# Patient Record
Sex: Male | Born: 1975 | Race: White | Marital: Single | Smoking: Never smoker
Health system: Southern US, Community
[De-identification: ages and names within clinical notes are randomized; demographics above are authoritative.]

## PROBLEM LIST (undated history)

## (undated) DIAGNOSIS — J45909 Unspecified asthma, uncomplicated: Secondary | ICD-10-CM

---

## 2011-04-16 ENCOUNTER — Ambulatory Visit
Admission: RE | Admit: 2011-04-16 | Discharge: 2011-04-16 | Disposition: A | Discharge status: Home / Self Care | Payer: PRIVATE HEALTH INSURANCE | Source: Ambulatory Visit | Attending: Family Medicine | Admitting: Family Medicine

## 2011-04-16 ENCOUNTER — Other Ambulatory Visit: Payer: Self-pay | Admitting: Family Medicine

## 2011-04-16 DIAGNOSIS — R1032 Left lower quadrant pain: Secondary | ICD-10-CM

## 2011-04-16 MED ORDER — IOHEXOL 300 MG/ML  SOLN
100.0000 mL | Freq: Once | INTRAMUSCULAR | Status: AC | PRN
  Administered 2011-04-16: 100 mL via INTRAVENOUS

## 2012-03-13 IMAGING — CT CT ABD-PELV W/ CM
2 of 4 series · 17 of 46 positions shown, 19 images · IV contrast (30CC OMNI 300 & [ID] OMNI 300)
Comparison: None.

CLINICAL DATA: Left lower quadrant pain.  Rule out diverticulitis.

CT ABDOMEN AND PELVIS WITH CONTRAST
TECHNIQUE: Multidetector CT imaging of the abdomen and pelvis was
performed following the standard protocol during bolus
administration of intravenous contrast.
Contrast: 100mL OMNIPAQUE IOHEXOL 300 MG/ML IV SOLN

[Series 2: abd/pelvis with · axial · 0.70mm/px · z∈[-386,+14]mm · 14 of 88 slices shown, 16 images]
[im 4/88  soft-tissue]
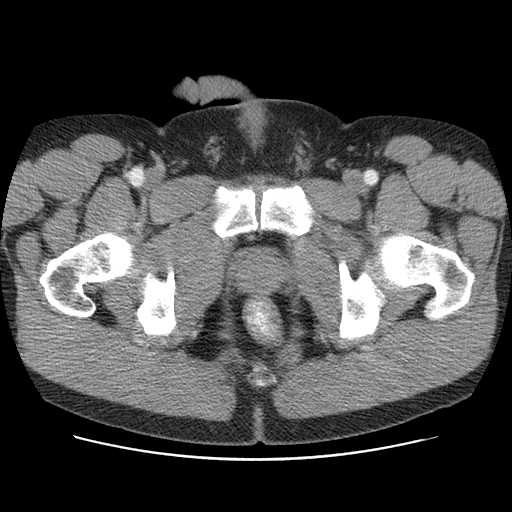
[im 4/88  bone]
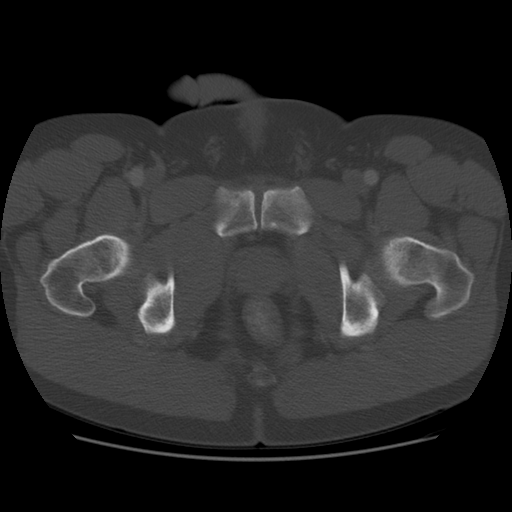
[im 11/88  soft-tissue]
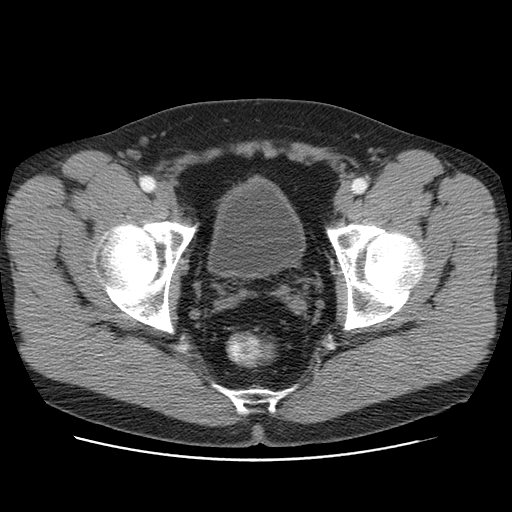
[im 18/88  soft-tissue]
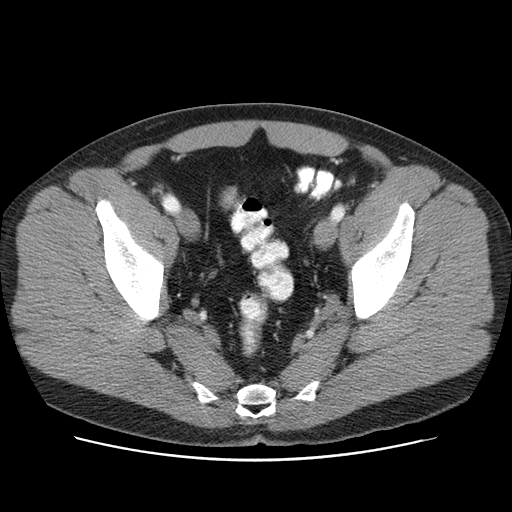
[im 25/88  soft-tissue]
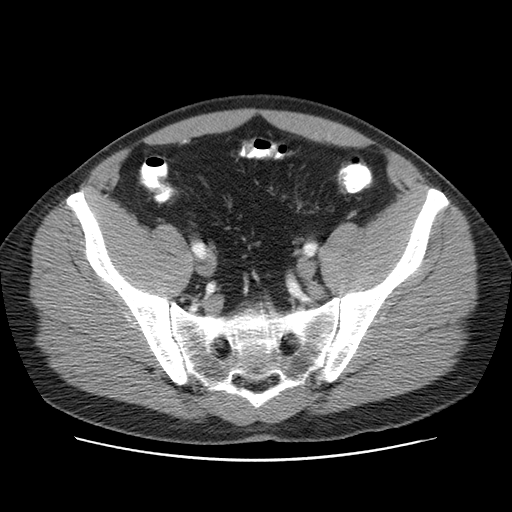
[im 28/88  soft-tissue]
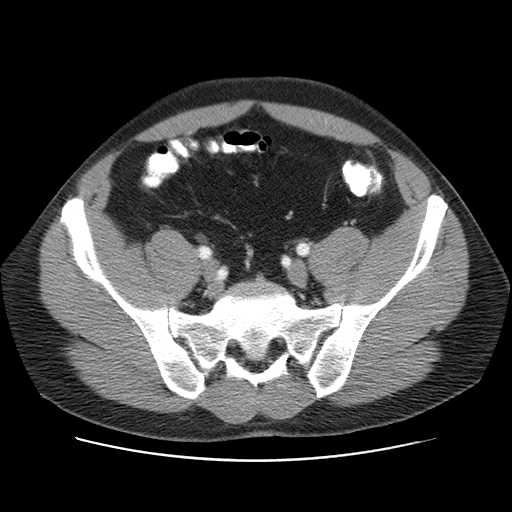
[im 35/88  soft-tissue]
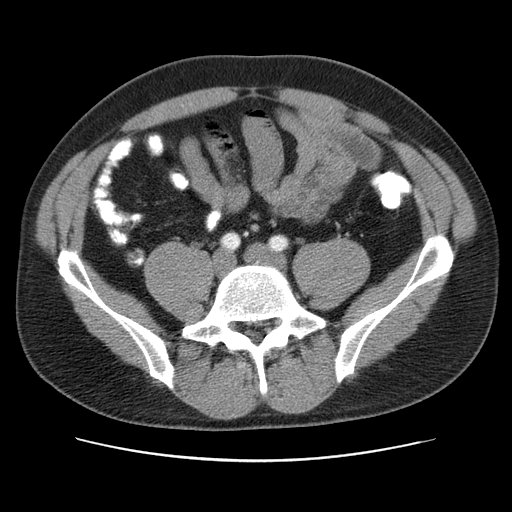
[im 42/88  soft-tissue]
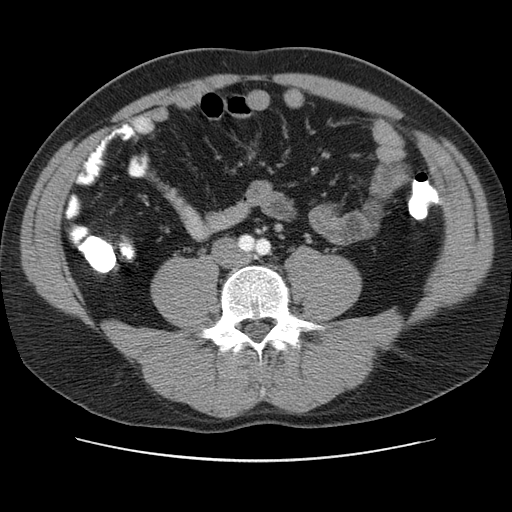
[im 46/88  soft-tissue]
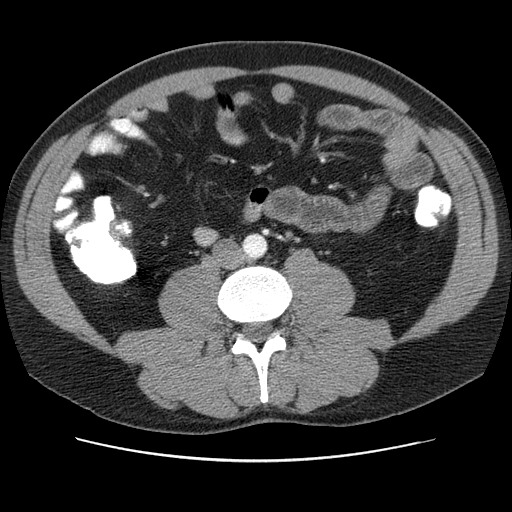
[im 53/88  soft-tissue]
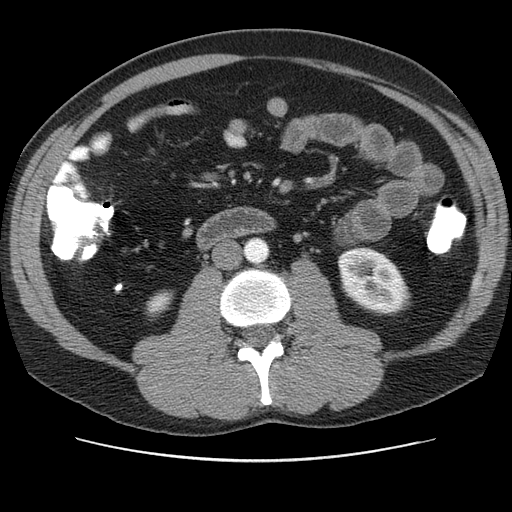
[im 53/88  bone]
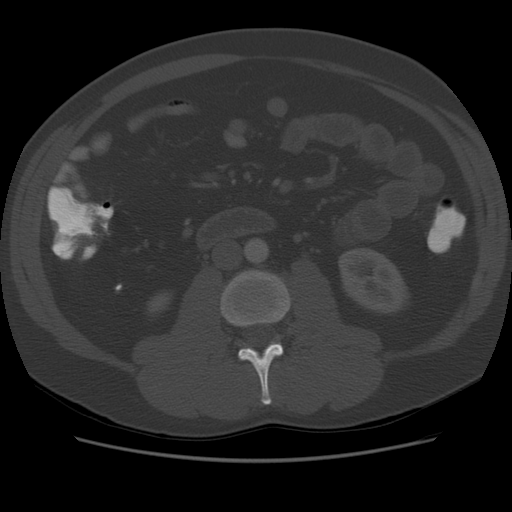
[im 60/88  soft-tissue]
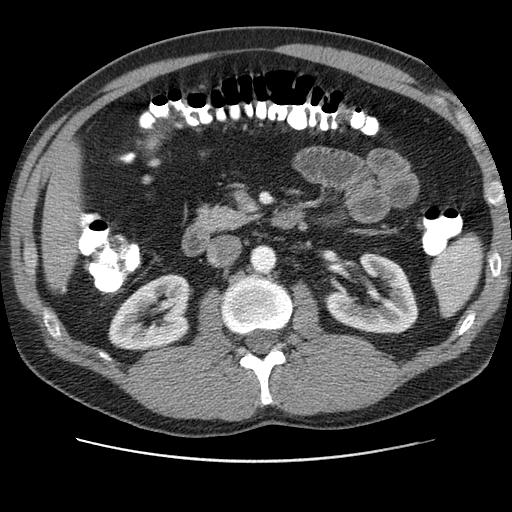
[im 67/88  soft-tissue]
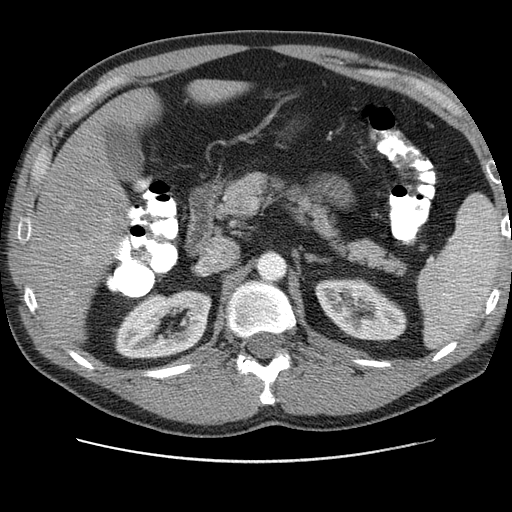
[im 70/88  soft-tissue]
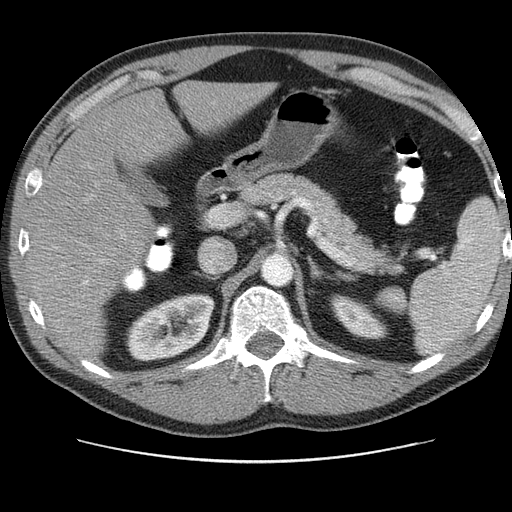
[im 77/88  soft-tissue]
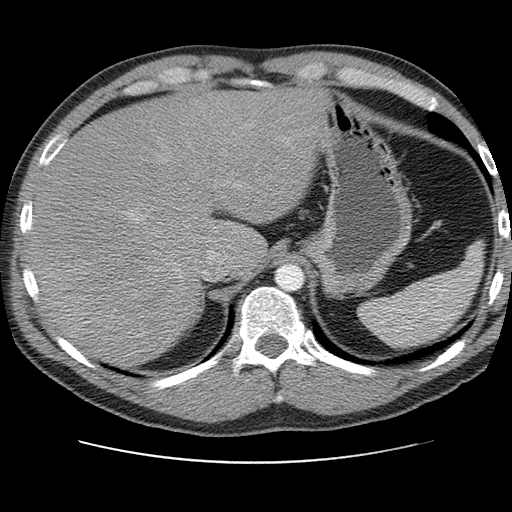
[im 84/88  soft-tissue]
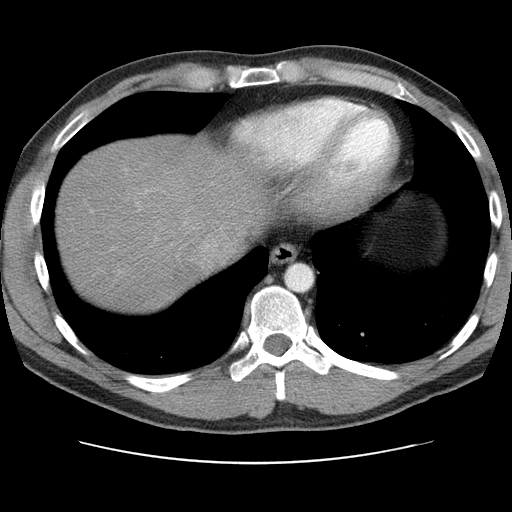

[Series 400: cor · coronal · 0.97mm/px · 3 of 121 slices shown]
[im 41/121  soft-tissue]
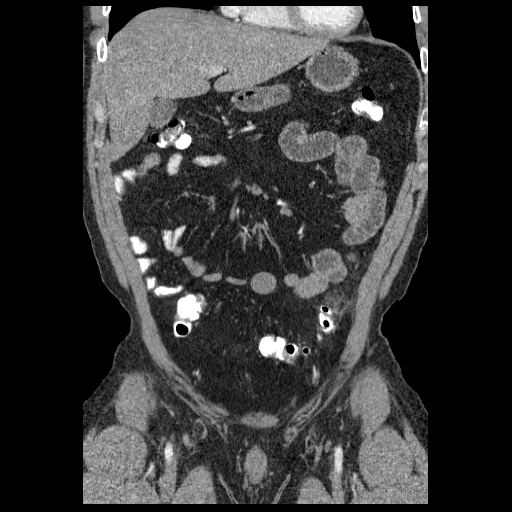
[im 54/121  soft-tissue]
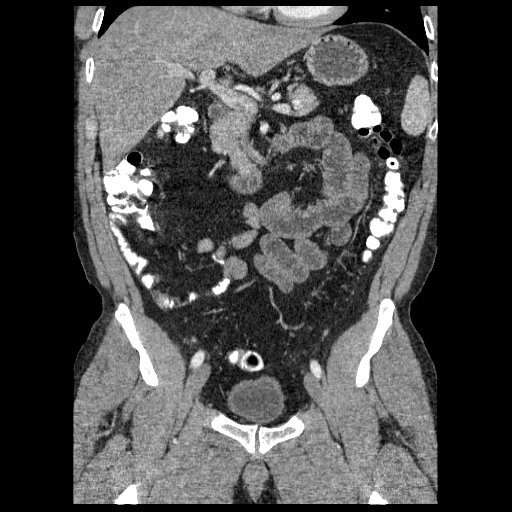
[im 67/121  soft-tissue]
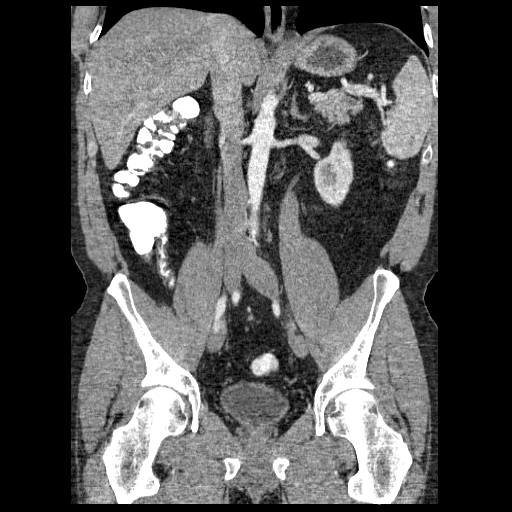

[17 of 46 positions shown; findings below may reference images not displayed]

FINDINGS: Clear lung bases.  Normal heart size without pericardial
or pleural effusion.  Mild hepatic steatosis.  Hepatic dome is
minimally excluded.  No focal liver lesion.  Normal spleen,
stomach, pancreas, gallbladder, biliary tract, adrenal glands,
kidneys.  Separate origins of the splenic artery and common hepatic
artery. No retroperitoneal or retrocrural adenopathy.

Minimal sigmoid diverticulosis.  Minimal edema is identified along
the anterior aspect of the descending/sigmoid junction, including
on image 58.  No abscess or free perforation. Normal terminal ileum
and appendix.  Normal small bowel without abdominal ascites.

  No pelvic adenopathy.    Normal urinary bladder and prostate.  No
significant free fluid.  No acute osseous abnormality.
IMPRESSION: 1.  Uncomplicated diverticulitis at the junction of the descending
and sigmoid colon.
2.  Hepatic steatosis.

## 2020-10-08 ENCOUNTER — Other Ambulatory Visit: Payer: Self-pay

## 2020-10-08 ENCOUNTER — Emergency Department (HOSPITAL_COMMUNITY): Payer: No Typology Code available for payment source | Admitting: Certified Registered"

## 2020-10-08 ENCOUNTER — Ambulatory Visit (HOSPITAL_COMMUNITY)
Admission: EM | Admit: 2020-10-08 | Discharge: 2020-10-08 | Disposition: A | Discharge status: Home / Self Care | Payer: No Typology Code available for payment source | Attending: Gastroenterology | Admitting: Gastroenterology

## 2020-10-08 ENCOUNTER — Encounter (HOSPITAL_COMMUNITY): Payer: Self-pay

## 2020-10-08 ENCOUNTER — Emergency Department (HOSPITAL_COMMUNITY): Payer: No Typology Code available for payment source

## 2020-10-08 ENCOUNTER — Encounter (HOSPITAL_COMMUNITY)
Admission: EM | Disposition: A | Discharge status: Home / Self Care | Payer: Self-pay | Source: Home / Self Care | Attending: Emergency Medicine

## 2020-10-08 DIAGNOSIS — S1121XA Laceration without foreign body of pharynx and cervical esophagus, initial encounter: Secondary | ICD-10-CM | POA: Diagnosis not present

## 2020-10-08 DIAGNOSIS — X58XXXA Exposure to other specified factors, initial encounter: Secondary | ICD-10-CM | POA: Diagnosis not present

## 2020-10-08 DIAGNOSIS — K2210 Ulcer of esophagus without bleeding: Secondary | ICD-10-CM | POA: Diagnosis not present

## 2020-10-08 DIAGNOSIS — R1310 Dysphagia, unspecified: Secondary | ICD-10-CM | POA: Diagnosis present

## 2020-10-08 DIAGNOSIS — T18128A Food in esophagus causing other injury, initial encounter: Secondary | ICD-10-CM | POA: Diagnosis not present

## 2020-10-08 DIAGNOSIS — K9181 Other intraoperative complications of digestive system: Secondary | ICD-10-CM | POA: Insufficient documentation

## 2020-10-08 DIAGNOSIS — Z833 Family history of diabetes mellitus: Secondary | ICD-10-CM | POA: Diagnosis not present

## 2020-10-08 DIAGNOSIS — W44F3XA Food entering into or through a natural orifice, initial encounter: Secondary | ICD-10-CM | POA: Diagnosis present

## 2020-10-08 DIAGNOSIS — Z791 Long term (current) use of non-steroidal anti-inflammatories (NSAID): Secondary | ICD-10-CM | POA: Diagnosis not present

## 2020-10-08 DIAGNOSIS — K2900 Acute gastritis without bleeding: Secondary | ICD-10-CM | POA: Insufficient documentation

## 2020-10-08 DIAGNOSIS — Z20822 Contact with and (suspected) exposure to covid-19: Secondary | ICD-10-CM | POA: Insufficient documentation

## 2020-10-08 DIAGNOSIS — Y9289 Other specified places as the place of occurrence of the external cause: Secondary | ICD-10-CM | POA: Insufficient documentation

## 2020-10-08 DIAGNOSIS — Y738 Miscellaneous gastroenterology and urology devices associated with adverse incidents, not elsewhere classified: Secondary | ICD-10-CM | POA: Insufficient documentation

## 2020-10-08 DIAGNOSIS — K222 Esophageal obstruction: Secondary | ICD-10-CM | POA: Diagnosis not present

## 2020-10-08 DIAGNOSIS — Y658 Other specified misadventures during surgical and medical care: Secondary | ICD-10-CM | POA: Insufficient documentation

## 2020-10-08 DIAGNOSIS — K3189 Other diseases of stomach and duodenum: Secondary | ICD-10-CM | POA: Insufficient documentation

## 2020-10-08 DIAGNOSIS — K269 Duodenal ulcer, unspecified as acute or chronic, without hemorrhage or perforation: Secondary | ICD-10-CM | POA: Diagnosis not present

## 2020-10-08 HISTORY — PX: ESOPHAGEAL BRUSHING: SHX6842

## 2020-10-08 HISTORY — PX: ESOPHAGOGASTRODUODENOSCOPY (EGD) WITH PROPOFOL: SHX5813

## 2020-10-08 HISTORY — DX: Unspecified asthma, uncomplicated: J45.909

## 2020-10-08 HISTORY — PX: FOREIGN BODY REMOVAL: SHX962

## 2020-10-08 LAB — RESP PANEL BY RT-PCR (FLU A&B, COVID) ARPGX2
Influenza A by PCR: NEGATIVE
Influenza B by PCR: NEGATIVE
SARS Coronavirus 2 by RT PCR: NEGATIVE

## 2020-10-08 SURGERY — ESOPHAGOGASTRODUODENOSCOPY (EGD) WITH PROPOFOL
Anesthesia: Monitor Anesthesia Care

## 2020-10-08 SURGERY — ESOPHAGOGASTRODUODENOSCOPY (EGD) WITH PROPOFOL
Anesthesia: General

## 2020-10-08 MED ORDER — PROPOFOL 10 MG/ML IV BOLUS
INTRAVENOUS | Status: DC | PRN
  Administered 2020-10-08: 200 mg via INTRAVENOUS

## 2020-10-08 MED ORDER — SUCCINYLCHOLINE CHLORIDE 20 MG/ML IJ SOLN
INTRAMUSCULAR | Status: DC | PRN
  Administered 2020-10-08: 100 mg via INTRAVENOUS

## 2020-10-08 MED ORDER — LIDOCAINE 2% (20 MG/ML) 5 ML SYRINGE
INTRAMUSCULAR | Status: DC | PRN
  Administered 2020-10-08: 100 mg via INTRAVENOUS

## 2020-10-08 MED ORDER — SODIUM CHLORIDE 0.9 % IV BOLUS
1000.0000 mL | Freq: Once | INTRAVENOUS | Status: AC
  Administered 2020-10-08: 1000 mL via INTRAVENOUS

## 2020-10-08 MED ORDER — ONDANSETRON HCL 4 MG/2ML IJ SOLN
INTRAMUSCULAR | Status: DC | PRN
  Administered 2020-10-08: 4 mg via INTRAVENOUS

## 2020-10-08 MED ORDER — LACTATED RINGERS IV SOLN
INTRAVENOUS | Status: AC | PRN
  Administered 2020-10-08: 1000 mL via INTRAVENOUS

## 2020-10-08 MED ORDER — DEXAMETHASONE SODIUM PHOSPHATE 10 MG/ML IJ SOLN
INTRAMUSCULAR | Status: DC | PRN
  Administered 2020-10-08: 5 mg via INTRAVENOUS

## 2020-10-08 SURGICAL SUPPLY — 14 items

## 2020-10-08 NOTE — Anesthesia Procedure Notes (Signed)
Procedure Name: Intubation Performed by: Kizzie Fantasia, CRNA Pre-anesthesia Checklist: Patient identified, Emergency Drugs available, Suction available, Patient being monitored and Timeout performed Patient Re-evaluated:Patient Re-evaluated prior to induction Preoxygenation: Pre-oxygenation with 100% oxygen Induction Type: IV induction and Rapid sequence Laryngoscope Size: Glidescope and 4 Grade View: Grade I Tube type: Oral Tube size: 7.5 mm Number of attempts: 1 Airway Equipment and Method: Stylet Placement Confirmation: ETT inserted through vocal cords under direct vision,  positive ETCO2 and breath sounds checked- equal and bilateral Secured at: 23 cm Tube secured with: Tape Dental Injury: Teeth and Oropharynx as per pre-operative assessment

## 2020-10-08 NOTE — Consult Note (Signed)
Referring Provider: Dr. Tracie Harrier Primary Care Physician:  Pcp, No Primary Gastroenterologist:  Gentry Fitz  Reason for Consultation:  Dysphagia  HPI: Don Gross is a 45 y.o. male who had the acute onset of dysphagia as soon as he started eating a chicken quesadilla last night and since then has not been able to swallow his secretions or liquids. He vomited up a small amount of fluid and saw a few specks of blood in it since the food hung up. Has sporadic episodes of food hanging up in the past but never to this degree. Denies heartburn. Has never had an EGD in the past. Covid negative.  Past Medical History:  Diagnosis Date  . Asthma     History reviewed. No pertinent surgical history.  Prior to Admission medications   Medication Sig Start Date End Date Taking? Authorizing Provider  albuterol (VENTOLIN HFA) 108 (90 Base) MCG/ACT inhaler Inhale 2 puffs into the lungs every 6 (six) hours as needed for wheezing or shortness of breath.   Yes [provider]  ibuprofen (ADVIL) 200 MG tablet Take 400 mg by mouth every 6 (six) hours as needed for mild pain.   Yes [provider]  Multiple Vitamin (MULTIVITAMIN WITH MINERALS) TABS tablet Take 1 tablet by mouth daily.   Yes [provider]    Scheduled Meds: Continuous Infusions: . lactated ringers 1,000 mL (10/08/20 1723)   PRN Meds:.lactated ringers  Allergies as of 10/08/2020  . (No Known Allergies)    Family History  Problem Relation Age of Onset  . Diabetes Father     Social History   Socioeconomic History  . Marital status: Single    Spouse name: Not on file  . Number of children: Not on file  . Years of education: Not on file  . Highest education level: Not on file  Occupational History  . Not on file  Tobacco Use  . Smoking status: Never Smoker  . Smokeless tobacco: Never Used  Vaping Use  . Vaping Use: Never used  Substance and Sexual Activity  . Alcohol use: Yes  . Drug use: Never  .  Sexual activity: Not on file  Other Topics Concern  . Not on file  Social History Narrative  . Not on file   Social Determinants of Health   Financial Resource Strain: Not on file  Food Insecurity: Not on file  Transportation Needs: Not on file  Physical Activity: Not on file  Stress: Not on file  Social Connections: Not on file  Intimate Partner Violence: Not on file    Review of Systems: All negative except as stated above in HPI.  Physical Exam: Vital signs: Vitals:   10/08/20 1645 10/08/20 1717  BP: (!) 123/94 (!) 144/105  Pulse: 61 87  Resp: 12 17  Temp:  98.6 F (37 C)  SpO2: 99% 100%     General:   Alert,  Well-developed, well-nourished, pleasant and cooperative in NAD Head: normocephalic, atraumatic Eyes: anicteric sclera ENT: oropharynx clear Neck: supple, nontender Lungs:  Clear throughout to auscultation.   No wheezes, crackles, or rhonchi. No acute distress. Heart:  Regular rate and rhythm; no murmurs, clicks, rubs,  or gallops. Abdomen: soft, nontender, nondistended, +BS  Rectal:  Deferred Ext: no edema  GI:  Lab Results: No results for input(s): WBC, HGB, HCT, PLT in the last 72 hours. BMET No results for input(s): NA, K, CL, CO2, GLUCOSE, BUN, CREATININE, CALCIUM in the last 72 hours. LFT No results for input(s): PROT,  ALBUMIN, AST, ALT, ALKPHOS, BILITOT, BILIDIR, IBILI in the last 72 hours. PT/INR No results for input(s): LABPROT, INR in the last 72 hours.   Studies/Results: DG Chest 2 View  Result Date: 10/08/2020 CLINICAL DATA:  Food impaction, dysphagia EXAM: CHEST - 2 VIEW COMPARISON:  None. FINDINGS: The heart size and mediastinal contours are within normal limits. Both lungs are clear. The visualized skeletal structures are unremarkable. IMPRESSION: Negative. Electronically Signed   By: Kevin  Dover M.D.   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  Suspect esophageal inflammation and trauma from the food so will likely need esophageal dilation at a later date but consent obtained in case trauma from food is minimal. Doubt peptic stricture. Eosinophilic esophagitis also possible. EGD now.    LOS: 0 days   Zayden Maffei C Elissia Spiewak  10/08/2020, 5:58 PM  Questions please call [phone]  

## 2020-10-08 NOTE — ED Provider Notes (Signed)
Received pt in signout from Dr. Dalene Seltzer. Pt w/ food impaction, consulted w/ [facility] Dr. Dulce Sellar who wanted to wait for COVID test results prior to endoscopy.  COVID negative. Pt taken for endoscopy by Dr. Bosie Clos.   Don Gross, Don Finland, MD 10/08/20 1728

## 2020-10-08 NOTE — Interval H&P Note (Signed)
History and Physical Interval Note:  10/08/2020 6:52 PM  Don Gross  has presented today for surgery, with the diagnosis of food bolus.  The various methods of treatment have been discussed with the patient and family. After consideration of risks, benefits and other options for treatment, the patient has consented to  Procedure(s): ESOPHAGOGASTRODUODENOSCOPY (EGD) WITH PROPOFOL (N/A) ESOPHAGEAL BRUSHING FOREIGN BODY REMOVAL as a surgical intervention.  The patient's history has been reviewed, patient examined, no change in status, stable for surgery.  I have reviewed the patient's chart and labs.  Questions were answered to the patient's satisfaction.     [name]

## 2020-10-08 NOTE — ED Triage Notes (Signed)
Patient states he was eating a chicken quesadilla last night and thinks he got chicken stuck/ patient can not swallow his own saliva.

## 2020-10-08 NOTE — ED Notes (Addendum)
Coming from walk in clinic-patient thinks he has "something stuck in throat"

## 2020-10-08 NOTE — H&P (View-Only) (Signed)
Referring Provider: Dr. Tracie Harrier Primary Care Physician:  Pcp, No Primary Gastroenterologist:  Gentry Fitz  Reason for Consultation:  Dysphagia  HPI: Don Gross is a 45 y.o. male who had the acute onset of dysphagia as soon as he started eating a chicken quesadilla last night and since then has not been able to swallow his secretions or liquids. He vomited up a small amount of fluid and saw a few specks of blood in it since the food hung up. Has sporadic episodes of food hanging up in the past but never to this degree. Denies heartburn. Has never had an EGD in the past. Covid negative.  Past Medical History:  Diagnosis Date  . Asthma     History reviewed. No pertinent surgical history.  Prior to Admission medications   Medication Sig Start Date End Date Taking? Authorizing Provider  albuterol (VENTOLIN HFA) 108 (90 Base) MCG/ACT inhaler Inhale 2 puffs into the lungs every 6 (six) hours as needed for wheezing or shortness of breath.   Yes [provider]  ibuprofen (ADVIL) 200 MG tablet Take 400 mg by mouth every 6 (six) hours as needed for mild pain.   Yes [provider]  Multiple Vitamin (MULTIVITAMIN WITH MINERALS) TABS tablet Take 1 tablet by mouth daily.   Yes [provider]    Scheduled Meds: Continuous Infusions: . lactated ringers 1,000 mL (10/08/20 1723)   PRN Meds:.lactated ringers  Allergies as of 10/08/2020  . (No Known Allergies)    Family History  Problem Relation Age of Onset  . Diabetes Father     Social History   Socioeconomic History  . Marital status: Single    Spouse name: Not on file  . Number of children: Not on file  . Years of education: Not on file  . Highest education level: Not on file  Occupational History  . Not on file  Tobacco Use  . Smoking status: Never Smoker  . Smokeless tobacco: Never Used  Vaping Use  . Vaping Use: Never used  Substance and Sexual Activity  . Alcohol use: Yes  . Drug use: Never  .  Sexual activity: Not on file  Other Topics Concern  . Not on file  Social History Narrative  . Not on file   Social Determinants of Health   Financial Resource Strain: Not on file  Food Insecurity: Not on file  Transportation Needs: Not on file  Physical Activity: Not on file  Stress: Not on file  Social Connections: Not on file  Intimate Partner Violence: Not on file    Review of Systems: All negative except as stated above in HPI.  Physical Exam: Vital signs: Vitals:   10/08/20 1645 10/08/20 1717  BP: (!) 123/94 (!) 144/105  Pulse: 61 87  Resp: 12 17  Temp:  98.6 F (37 C)  SpO2: 99% 100%     General:   Alert,  Well-developed, well-nourished, pleasant and cooperative in NAD Head: normocephalic, atraumatic Eyes: anicteric sclera ENT: oropharynx clear Neck: supple, nontender Lungs:  Clear throughout to auscultation.   No wheezes, crackles, or rhonchi. No acute distress. Heart:  Regular rate and rhythm; no murmurs, clicks, rubs,  or gallops. Abdomen: soft, nontender, nondistended, +BS  Rectal:  Deferred Ext: no edema  GI:  Lab Results: No results for input(s): WBC, HGB, HCT, PLT in the last 72 hours. BMET No results for input(s): NA, K, CL, CO2, GLUCOSE, BUN, CREATININE, CALCIUM in the last 72 hours. LFT No results for input(s): PROT,  ALBUMIN, AST, ALT, ALKPHOS, BILITOT, BILIDIR, IBILI in the last 72 hours. PT/INR No results for input(s): LABPROT, INR in the last 72 hours.   Studies/Results: DG Chest 2 View  Result Date: 10/08/2020 CLINICAL DATA:  Food impaction, dysphagia EXAM: CHEST - 2 VIEW COMPARISON:  None. FINDINGS: The heart size and mediastinal contours are within normal limits. Both lungs are clear. The visualized skeletal structures are unremarkable. IMPRESSION: Negative. Electronically Signed   By: Charlett Nose M.D.   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  Suspect esophageal inflammation and trauma from the food so will likely need esophageal dilation at a later date but consent obtained in case trauma from food is minimal. Doubt peptic stricture. Eosinophilic esophagitis also possible. EGD now.    LOS: 0 days   [name]  10/08/2020, 5:58 PM  Questions please call [phone]

## 2020-10-08 NOTE — Op Note (Signed)
[facility] Patient Name: Don Gross Procedure Date: 10/08/2020 MRN: [id number] Attending MD: [name] , MD Date of Birth: 08-10-1975 CSN: [id number] Age: 45 Admit Type: Emergency Department Procedure:                Upper GI endoscopy Indications:              Dysphagia, Foreign body in the esophagus Providers:                [name], MD, Janae Sauce. Steele Berg, RN,                            Lawson Radar, Technician, Mirian Mo, CRNA Referring MD:             Aliene Beams Medicines:                Propofol per Anesthesia, Monitored Anesthesia Care,                            Patient intubated prior to the EGD Complications:            No immediate complications. Estimated Blood Loss:     Estimated blood loss was minimal. Procedure:                Pre-Anesthesia Assessment:                           - Prior to the procedure, a History and Physical                            was performed, and patient medications and                            allergies were reviewed. The patient's tolerance of                            previous anesthesia was also reviewed. The risks                            and benefits of the procedure and the sedation                            options and risks were discussed with the patient.                            All questions were answered, and informed consent                            was obtained. Prior Anticoagulants: The patient has                            taken no previous anticoagulant or antiplatelet                            agents. ASA Grade Assessment: II - A patient with  mild systemic disease. After reviewing the risks                            and benefits, the patient was deemed in                            satisfactory condition to undergo the procedure.                           After obtaining informed consent, the endoscope was                            passed  under direct vision. Throughout the                            procedure, the patient's blood pressure, pulse, and                            oxygen saturations were monitored continuously. The                            GIF-H190 ([id number] Olympus gastroscope was                            introduced through the mouth, and advanced to the                            second part of duodenum. The upper GI endoscopy was                            performed with difficulty due to presence of food                            and stricture. Successful completion of the                            procedure was aided by withdrawing the scope and                            replacing with the pediatric endoscope,                            straightening and shortening the scope to obtain                            bowel loop reduction and performing the maneuvers                            documented (below) in this report. The patient                            tolerated the procedure well. Scope In: Scope Out: Findings:      Food was found in the distal esophagus. Removal of food was  accomplished. Estimated blood loss was minimal.      One benign-appearing, intrinsic severe (stenosis; an endoscope cannot       pass) stenosis was found 25 to 42 cm from the incisors. The stenosis was       traversed after downsizing scope. Dilation not attempted due to inflamed       mucosa.      Segmental severe mucosal changes characterized by congestion, erythema       and ulceration were found in the distal esophagus. Cells for cytology       were obtained by brushing. Estimated blood loss was minimal.      The Z-line was found 42 cm from the incisors.      Food lodged in distal esophagus was attempted to be removed with a talon       grasper but the food advanced down into the stomach when the attempt was       made. Unable to advance the endoscope distal to 25 cm where the       circumferential  stricture started and a shelf was noted. A superficial       rent in the esophageal mucosa occurred during attempted advancement of       the standard endoscope and the endoscope was withdrawn due to resistance       from the stricture. A peds endoscope was then inserted and it seemed to       coil at the proximal edge of the stricture so a guidewire was inserted       into the peds endoscope to increase its stiffness and the endoscope was       able to advance distal into the stricture. Diffuse edema and       inflammation was noted with white exudate concerning for infectious       esophagitis vs trauma from the food impaction. Cytologic brushing for       Candida and Herpes was completed. Dilation was not attempted due to the       severe esophagitis.      Segmental mild inflammation characterized by congestion (edema) and       erythema was found in the gastric antrum.      Few non-bleeding superficial duodenal ulcers with no stigmata of       bleeding were found in the duodenal bulb. The largest lesion was 2 mm in       largest dimension.      Patchy mildly erythematous mucosa without active bleeding and with no       stigmata of bleeding was found in the duodenal bulb.      The exam of the duodenum was otherwise normal. Impression:               - Food in the distal esophagus. Removal was                            successful.                           - Benign-appearing esophageal stenosis.                           - Congested, erythematous, ulcerated mucosa in the  esophagus. Cells for cytology obtained.                           - Z-line, 42 cm from the incisors.                           - Acute gastritis.                           - Non-bleeding duodenal ulcers with no stigmata of                            bleeding.                           - Erythematous duodenopathy. Moderate Sedation:      Not Applicable - Patient had care per  Anesthesia. Recommendation:           - Patient has a contact number available for                            emergencies. The signs and symptoms of potential                            delayed complications were discussed with the                            patient. Return to normal activities tomorrow.                            Written discharge instructions were provided to the                            patient.                           - Await pathology results.                           - Clear liquid diet.                           - Clear liquid diet today and tomorrow and if                            tolerates then slowly advance to soft diet. Avoid                            ALL meats for the next 2 weeks. Start taking                            Pantoprazole 40 mg by mouth once a day.                           - Repeat upper endoscopy in 1 month for dilation. Procedure Code(s):        ---  Professional ---                           [phone], Esophagogastroduodenoscopy, flexible,                            transoral; with removal of foreign body(s) Diagnosis Code(s):        --- Professional ---                           C16.606T, Food in esophagus causing other injury,                            initial encounter                           K22.2, Esophageal obstruction                           R13.10, Dysphagia, unspecified                           K22.10, Ulcer of esophagus without bleeding                           K26.9, Duodenal ulcer, unspecified as acute or                            chronic, without hemorrhage or perforation                           K29.00, Acute gastritis without bleeding                           K22.8, Other specified diseases of esophagus                           T18.108A, Unspecified foreign body in esophagus                            causing other injury, initial encounter                           K31.89, Other diseases of stomach and  duodenum CPT copyright 2019 [employer]. All rights reserved. The codes documented in this report are preliminary and upon coder review may  be revised to meet current compliance requirements. [name], MD 10/08/2020 6:48:40 PM This report has been signed electronically. Number of Addenda: 0

## 2020-10-08 NOTE — Transfer of Care (Signed)
Immediate Anesthesia Transfer of Care Note  Patient: Don Gross  Procedure(s) Performed: ESOPHAGOGASTRODUODENOSCOPY (EGD) WITH PROPOFOL (N/A ) ESOPHAGEAL BRUSHING FOREIGN BODY REMOVAL  Patient Location: PACU  Anesthesia Type:General  Level of Consciousness: sedated, patient cooperative and responds to stimulation  Airway & Oxygen Therapy: Patient Spontanous Breathing and Patient connected to face mask oxygen  Post-op Assessment: Report given to RN and Post -op Vital signs reviewed and stable  Post vital signs: Reviewed and stable  Last Vitals:  Vitals Value Taken Time  BP 134/90 10/08/20 1841  Temp    Pulse 81 10/08/20 1845  Resp 17 10/08/20 1845  SpO2 99 % 10/08/20 1845  Vitals shown include unvalidated device data.  Last Pain:  Vitals:   10/08/20 1717  TempSrc: Oral  PainSc: 0-No pain         Complications: No complications documented.

## 2020-10-08 NOTE — Anesthesia Preprocedure Evaluation (Addendum)
Anesthesia Evaluation  Patient identified by MRN, date of birth, ID band Patient awake    Reviewed: Allergy & Precautions, NPO status , Patient's Chart, lab work & pertinent test results  History of Anesthesia Complications Negative for: history of anesthetic complications  Airway Mallampati: II  TM Distance: >3 FB Neck ROM: Full    Dental  (+) Missing,    Pulmonary asthma ,    Pulmonary exam normal        Cardiovascular negative cardio ROS Normal cardiovascular exam     Neuro/Psych negative neurological ROS  negative psych ROS   GI/Hepatic negative GI ROS, Neg liver ROS,   Endo/Other  negative endocrine ROS  Renal/GU negative Renal ROS  negative genitourinary   Musculoskeletal negative musculoskeletal ROS (+)   Abdominal   Peds  Hematology negative hematology ROS (+)   Anesthesia Other Findings Day of surgery medications reviewed with patient.  Reproductive/Obstetrics negative OB ROS                            Anesthesia Physical Anesthesia Plan  ASA: II and emergent  Anesthesia Plan: General   Post-op Pain Management:    Induction: Intravenous, Rapid sequence and Cricoid pressure planned  PONV Risk Score and Plan: 2 and Treatment may vary due to age or medical condition, Ondansetron, Dexamethasone and Midazolam  Airway Management Planned: Oral ETT and Video Laryngoscope Planned  Additional Equipment: None  Intra-op Plan:   Post-operative Plan: Extubation in OR  Informed Consent: I have reviewed the patients History and Physical, chart, labs and discussed the procedure including the risks, benefits and alternatives for the proposed anesthesia with the patient or authorized representative who has indicated his/her understanding and acceptance.     Dental advisory given  Plan Discussed with: CRNA  Anesthesia Plan Comments:        Anesthesia Quick Evaluation

## 2020-10-08 NOTE — ED Notes (Signed)
Per Dr Trixie Deis) will have to scope patient...please get Covid test ASAP

## 2020-10-08 NOTE — Discharge Instructions (Addendum)
Clear liquid diet only today and tomorrow and if you tolerate that then starting on Thursday morning change to a full liquid diet with soups and broths and if tolerates that then soft diet in afternoon with scrambled eggs and mashed potatoes and other soft foods. You should NOT eat any meat for the next 2 weeks.   General Anesthesia, Adult, Care After This sheet gives you information about how to care for yourself after your procedure. Your health care provider may also give you more specific instructions. If you have problems or questions, contact your health care provider. What can I expect after the procedure? After the procedure, the following side effects are common:  Pain or discomfort at the IV site.  Nausea.  Vomiting.  Sore throat.  Trouble concentrating.  Feeling cold or chills.  Feeling weak or tired.  Sleepiness and fatigue.  Soreness and body aches. These side effects can affect parts of the body that were not involved in surgery. Follow these instructions at home: For the time period you were told by your health care provider:  Rest.  Do not participate in activities where you could fall or become injured.  Do not drive or use machinery.  Do not drink alcohol.  Do not take sleeping pills or medicines that cause drowsiness.  Do not make important decisions or sign legal documents.  Do not take care of children on your own.   Eating and drinking  Follow any instructions from your health care provider about eating or drinking restrictions. General instructions  If you have sleep apnea, surgery and certain medicines can increase your risk for breathing problems. Follow instructions from your health care provider about wearing your sleep device: ? Anytime you are sleeping, including during daytime naps. ? While taking prescription pain medicines, sleeping medicines, or medicines that make you drowsy.  Have a responsible adult stay with you for the time you  are told. It is important to have someone help care for you until you are awake and alert.  Return to your normal activities as told by your health care provider. Ask your health care provider what activities are safe for you.  Take over-the-counter and prescription medicines only as told by your health care provider.  If you smoke, do not smoke without supervision.  Keep all follow-up visits as told by your health care provider. This is important. Contact a health care provider if:  You have nausea or vomiting that does not get better with medicine.  You cannot eat or drink without vomiting.  You have pain that does not get better with medicine.  You are unable to pass urine.  You develop a skin rash.  You have a fever.  You have redness around your IV site that gets worse. Get help right away if:  You have difficulty breathing.  You have chest pain.  You have blood in your urine or stool, or you vomit blood. Summary  After the procedure, it is common to have a sore throat or nausea. It is also common to feel tired.  Have a responsible adult stay with you for the time you are told. It is important to have someone help care for you until you are awake and alert.  Drink enough fluid to keep your urine pale yellow.    Return to your normal activities as told by your health care provider. Ask your health care provider what activities are safe for you. This information is not intended to replace advice  given to you by your health care provider. Make sure you discuss any questions you have with your health care provider. Document Revised: 03/28/2020 Document Reviewed: 10/26/2019 Elsevier Patient Education  2021 [employer].

## 2020-10-08 NOTE — Anesthesia Postprocedure Evaluation (Signed)
Anesthesia Post Note  Patient: Don Gross  Procedure(s) Performed: ESOPHAGOGASTRODUODENOSCOPY (EGD) WITH PROPOFOL (N/A ) ESOPHAGEAL BRUSHING FOREIGN BODY REMOVAL     Patient location during evaluation: PACU Anesthesia Type: General Level of consciousness: awake and alert and oriented Pain management: pain level controlled Vital Signs Assessment: post-procedure vital signs reviewed and stable Respiratory status: spontaneous breathing, nonlabored ventilation and respiratory function stable Cardiovascular status: blood pressure returned to baseline Postop Assessment: no apparent nausea or vomiting Anesthetic complications: no   No complications documented.  Last Vitals:  Vitals:   10/08/20 1915 10/08/20 1930  BP: 138/89 (!) 138/94  Pulse: 73 72  Resp: 16 18  Temp:    SpO2: 98% 100%    Last Pain:  Vitals:   10/08/20 1930  TempSrc:   PainSc: 0-No pain                 Kaylyn Layer

## 2020-10-08 NOTE — ED Provider Notes (Signed)
[facility]-EMERGENCY DEPT Provider Note   CSN: [id number] Arrival date & time: 10/08/20  1350     History Chief Complaint  Patient presents with  . food stuck    Don Gross is a 45 y.o. male.  HPI     45yo male with history of asthma presents with concern for difficulty swallowing.  Has had intermittent episodes of dysphagia and feeling like food intermittently sticking over the last few years.  Usually can try to make himself swallow and force it down however last night had a quesadilla and felt it get stuck and it has not moved.  He tried some forceful swallowing but it did not pass, noted small amount of blood come up with one time.  Any liquids he has tried immediately are regurgitated. He is unable to swallow saliva.  Feeling like something stuck/[ressure upper part of chest but no other chest pain, dyspnea, fever, cough.     Past Medical History:  Diagnosis Date  . Asthma     Patient Active Problem List   Diagnosis Date Noted  . Dysphagia 10/08/2020  . Food impaction of esophagus 10/08/2020    History reviewed. No pertinent surgical history.     Family History  Problem Relation Age of Onset  . Diabetes Father     Social History   Tobacco Use  . Smoking status: Never Smoker  . Smokeless tobacco: Never Used  Vaping Use  . Vaping Use: Never used  Substance Use Topics  . Alcohol use: Yes  . Drug use: Never    Home Medications Prior to Admission medications   Medication Sig Start Date End Date Taking? Authorizing Provider  albuterol (VENTOLIN HFA) 108 (90 Base) MCG/ACT inhaler Inhale 2 puffs into the lungs every 6 (six) hours as needed for wheezing or shortness of breath.   Yes [provider]  ibuprofen (ADVIL) 200 MG tablet Take 400 mg by mouth every 6 (six) hours as needed for mild pain.   Yes [provider]  Multiple Vitamin (MULTIVITAMIN WITH MINERALS) TABS tablet Take 1 tablet by mouth daily.   Yes [provider]    Allergies    Patient has no known allergies.  Review of Systems   Review of Systems  Constitutional: Negative for fever.  HENT: Positive for drooling and trouble swallowing.   Respiratory: Negative for cough and shortness of breath.   Cardiovascular: Positive for chest pain (upper chest sensation something is stuck).  Gastrointestinal: Negative for nausea and vomiting.    Physical Exam Updated Vital Signs BP (!) 138/94 (BP Location: Right Arm)   Pulse 72   Temp 97.7 F (36.5 C)   Resp 18   Ht 6' (1.829 m)   Wt 98.9 kg   SpO2 100%   BMI 29.57 kg/m   Physical Exam Vitals and nursing note reviewed.  Constitutional:      General: He is not in acute distress.    Appearance: He is well-developed. He is not diaphoretic.  HENT:     Head: Normocephalic and atraumatic.  Eyes:     Conjunctiva/sclera: Conjunctivae normal.  Cardiovascular:     Rate and Rhythm: Normal rate and regular rhythm.     Heart sounds: Normal heart sounds. No murmur heard. No friction rub. No gallop.   Pulmonary:     Effort: Pulmonary effort is normal. No respiratory distress.     Breath sounds: Normal breath sounds. No wheezing or rales.  Abdominal:     General:  There is no distension.     Palpations: Abdomen is soft.     Tenderness: There is no abdominal tenderness. There is no guarding.  Musculoskeletal:     Cervical back: Normal range of motion.  Skin:    General: Skin is warm and dry.  Neurological:     Mental Status: He is alert and oriented to person, place, and time.     ED Results / Procedures / Treatments   Labs (all labs ordered are listed, but only abnormal results are displayed) Labs Reviewed  RESP PANEL BY RT-PCR (FLU A&B, COVID) ARPGX2  CYTOLOGY - NON PAP    EKG None  Radiology DG Chest 2 View  Result Date: 10/08/2020 CLINICAL DATA:  Food impaction, dysphagia EXAM: CHEST - 2 VIEW COMPARISON:  None. FINDINGS: The heart size and mediastinal contours are  within normal limits. Both lungs are clear. The visualized skeletal structures are unremarkable. IMPRESSION: Negative. Electronically Signed   By: Charlett Nose M.D.   On: 10/08/2020 15:05    Procedures Procedures   Medications Ordered in ED Medications  sodium chloride 0.9 % bolus 1,000 mL (0 mLs Intravenous Stopped 10/08/20 1645)  lactated ringers infusion ( Intravenous Anesthesia Volume Adjustment 10/08/20 1836)    ED Course  I have reviewed the triage vital signs and the nursing notes.  Pertinent labs & imaging results that were available during my care of the patient were reviewed by me and considered in my medical decision making (see chart for details).    MDM Rules/Calculators/A&P                          45yo male with history of asthma presents with concern for food impaction since last night.  Clinically low concern for esophageal perforation or complications.  Dr. Dulce Sellar consulted and recommends calling again when COVID test is back with plan to take to endoscopy.      Final Clinical Impression(s) / ED Diagnoses Final diagnoses:  Food impaction of esophagus, initial encounter    Rx / DC Orders ED Discharge Orders    None       Alvira Monday, MD 10/10/20 [phone]

## 2020-10-10 ENCOUNTER — Encounter (HOSPITAL_COMMUNITY): Payer: Self-pay | Admitting: Gastroenterology

## 2020-10-15 LAB — CYTOLOGY - NON PAP

## 2020-10-31 ENCOUNTER — Other Ambulatory Visit: Payer: Self-pay | Admitting: Gastroenterology

## 2020-11-05 ENCOUNTER — Other Ambulatory Visit: Payer: Self-pay | Admitting: Gastroenterology

## 2020-11-05 DIAGNOSIS — R1310 Dysphagia, unspecified: Secondary | ICD-10-CM

## 2020-11-06 NOTE — Progress Notes (Signed)
Attempted to obtain medical history via telephone, unable to reach at this time.  

## 2020-11-11 ENCOUNTER — Other Ambulatory Visit (HOSPITAL_COMMUNITY)
Admission: RE | Admit: 2020-11-11 | Discharge: 2020-11-11 | Disposition: A | Discharge status: Home / Self Care | Payer: No Typology Code available for payment source | Source: Ambulatory Visit | Attending: Gastroenterology | Admitting: Gastroenterology

## 2020-11-11 DIAGNOSIS — Z20822 Contact with and (suspected) exposure to covid-19: Secondary | ICD-10-CM | POA: Diagnosis not present

## 2020-11-11 DIAGNOSIS — Z01812 Encounter for preprocedural laboratory examination: Secondary | ICD-10-CM | POA: Insufficient documentation

## 2020-11-11 LAB — SARS CORONAVIRUS 2 (TAT 6-24 HRS): SARS Coronavirus 2: NEGATIVE

## 2020-11-13 ENCOUNTER — Ambulatory Visit (HOSPITAL_COMMUNITY)
Admission: RE | Admit: 2020-11-13 | Discharge: 2020-11-13 | Disposition: A | Discharge status: Home / Self Care | Payer: No Typology Code available for payment source | Attending: Gastroenterology | Admitting: Gastroenterology

## 2020-11-13 ENCOUNTER — Encounter (HOSPITAL_COMMUNITY)
Admission: RE | Disposition: A | Discharge status: Home / Self Care | Payer: Self-pay | Source: Home / Self Care | Attending: Gastroenterology

## 2020-11-13 ENCOUNTER — Ambulatory Visit (HOSPITAL_COMMUNITY): Payer: No Typology Code available for payment source

## 2020-11-13 ENCOUNTER — Ambulatory Visit (HOSPITAL_COMMUNITY): Payer: No Typology Code available for payment source | Admitting: Certified Registered Nurse Anesthetist

## 2020-11-13 ENCOUNTER — Other Ambulatory Visit: Payer: Self-pay

## 2020-11-13 ENCOUNTER — Encounter (HOSPITAL_COMMUNITY): Payer: Self-pay | Admitting: Gastroenterology

## 2020-11-13 DIAGNOSIS — K222 Esophageal obstruction: Secondary | ICD-10-CM | POA: Diagnosis not present

## 2020-11-13 DIAGNOSIS — R1310 Dysphagia, unspecified: Secondary | ICD-10-CM | POA: Insufficient documentation

## 2020-11-13 DIAGNOSIS — Z79899 Other long term (current) drug therapy: Secondary | ICD-10-CM | POA: Diagnosis not present

## 2020-11-13 DIAGNOSIS — K2970 Gastritis, unspecified, without bleeding: Secondary | ICD-10-CM | POA: Diagnosis not present

## 2020-11-13 HISTORY — PX: SAVORY DILATION: SHX5439

## 2020-11-13 HISTORY — PX: ESOPHAGOGASTRODUODENOSCOPY (EGD) WITH PROPOFOL: SHX5813

## 2020-11-13 SURGERY — ESOPHAGOGASTRODUODENOSCOPY (EGD) WITH PROPOFOL
Anesthesia: Monitor Anesthesia Care

## 2020-11-13 MED ORDER — PROPOFOL 10 MG/ML IV BOLUS
INTRAVENOUS | Status: DC | PRN
  Administered 2020-11-13: 30 mg via INTRAVENOUS
  Administered 2020-11-13: 50 mg via INTRAVENOUS

## 2020-11-13 MED ORDER — PROPOFOL 500 MG/50ML IV EMUL
INTRAVENOUS | Status: DC | PRN
  Administered 2020-11-13: 150 ug/kg/min via INTRAVENOUS

## 2020-11-13 MED ORDER — LACTATED RINGERS IV SOLN: INTRAVENOUS | Status: DC

## 2020-11-13 MED ORDER — LIDOCAINE 2% (20 MG/ML) 5 ML SYRINGE
INTRAMUSCULAR | Status: DC | PRN
  Administered 2020-11-13: 100 mg via INTRAVENOUS

## 2020-11-13 SURGICAL SUPPLY — 14 items

## 2020-11-13 NOTE — Anesthesia Preprocedure Evaluation (Signed)
Anesthesia Evaluation  Patient identified by MRN, date of birth, ID band Patient awake    Reviewed: Allergy & Precautions, NPO status , Patient's Chart, lab work & pertinent test results  History of Anesthesia Complications Negative for: history of anesthetic complications  Airway Mallampati: II  TM Distance: >3 FB Neck ROM: Full    Dental  (+) Missing,    Pulmonary asthma ,    Pulmonary exam normal breath sounds clear to auscultation       Cardiovascular negative cardio ROS Normal cardiovascular exam Rhythm:Regular Rate:Normal     Neuro/Psych negative neurological ROS  negative psych ROS   GI/Hepatic Neg liver ROS, Dysphagia Hx/o recent food impaction   Endo/Other  negative endocrine ROS  Renal/GU negative Renal ROS  negative genitourinary   Musculoskeletal negative musculoskeletal ROS (+)   Abdominal   Peds  Hematology negative hematology ROS (+)   Anesthesia Other Findings   Reproductive/Obstetrics negative OB ROS                             Anesthesia Physical  Anesthesia Plan  ASA: II  Anesthesia Plan: MAC   Post-op Pain Management:    Induction: Intravenous  PONV Risk Score and Plan: 2 and Treatment may vary due to age or medical condition, Ondansetron and Propofol infusion  Airway Management Planned: Natural Airway and Nasal Cannula  Additional Equipment: None  Intra-op Plan:   Post-operative Plan:   Informed Consent: I have reviewed the patients History and Physical, chart, labs and discussed the procedure including the risks, benefits and alternatives for the proposed anesthesia with the patient or authorized representative who has indicated his/her understanding and acceptance.     Dental advisory given  Plan Discussed with: CRNA  Anesthesia Plan Comments:         Anesthesia Quick Evaluation

## 2020-11-13 NOTE — H&P (Signed)
Date of Initial H&P: 10/31/20  History reviewed, patient examined, no change in status, stable for surgery.

## 2020-11-13 NOTE — Interval H&P Note (Signed)
History and Physical Interval Note:  11/13/2020 11:46 AM  Don Gross  has presented today for surgery, with the diagnosis of Dysphagia/esophageal obstruction.  The various methods of treatment have been discussed with the patient and family. After consideration of risks, benefits and other options for treatment, the patient has consented to  Procedure(s): ESOPHAGOGASTRODUODENOSCOPY (EGD) WITH PROPOFOL (N/A) SAVORY DILATION vs balloon (N/A) as a surgical intervention.  The patient's history has been reviewed, patient examined, no change in status, stable for surgery.  I have reviewed the patient's chart and labs.  Questions were answered to the patient's satisfaction.     [name]

## 2020-11-13 NOTE — Op Note (Signed)
[facility] Patient Name: Don Gross Procedure Date: 11/13/2020 MRN: [id number] Attending MD: [name] , MD Date of Birth: [birth year] CSN: [id number] Age: 45 Admit Type: Outpatient Procedure:                Upper GI endoscopy Indications:              Dysphagia, Stricture of the esophagus Providers:                [name], MD, Rogue Jury, RN, Brion Aliment, Technician Referring MD:             Aliene Beams Medicines:                Propofol per Anesthesia, Monitored Anesthesia Care Complications:            No immediate complications. Estimated Blood Loss:     Estimated blood loss was minimal. Procedure:                Pre-Anesthesia Assessment:                           - Prior to the procedure, a History and Physical                            was performed, and patient medications and                            allergies were reviewed. The patient's tolerance of                            previous anesthesia was also reviewed. The risks                            and benefits of the procedure and the sedation                            options and risks were discussed with the patient.                            All questions were answered, and informed consent                            was obtained. Prior Anticoagulants: The patient has                            taken no previous anticoagulant or antiplatelet                            agents. ASA Grade Assessment: II - A patient with                            mild systemic disease. After reviewing the risks  and benefits, the patient was deemed in                            satisfactory condition to undergo the procedure.                           After obtaining informed consent, the endoscope was                            passed under direct vision. Throughout the                            procedure, the patient's blood  pressure, pulse, and                            oxygen saturations were monitored continuously. The                            [id number]-[id number] ([id number]) Olympus ultra slim endoscope                            was introduced through the mouth, and advanced to                            the second part of duodenum. The upper GI endoscopy                            was accomplished without difficulty. The patient                            tolerated the procedure well. Scope In: Scope Out: Findings:      One benign-appearing, intrinsic moderate (circumferential scarring or       stenosis; an endoscope may pass) stenosis was found in the distal       esophagus. The stenosis was traversed. A guidewire was placed under       fluoroscopic guidance and the scope was withdrawn. Dilation was       performed with a Savary dilator with no resistance at 7 mm, 8 mm and 9       mm, mild resistance at 10 mm and moderate resistance at 11 mm. The       dilation site was examined following endoscope reinsertion and showed       complete resolution of luminal narrowing. Estimated blood loss was       minimal.      The cardia and gastric fundus were normal on retroflexion.      The examined duodenum was normal.      Segmental mild inflammation characterized by congestion (edema) and       linear erosions was found in the prepyloric region of the stomach. Impression:               - Benign-appearing esophageal stenosis. Dilated.                           - Normal examined duodenum.                           -  Acute gastritis.                           - No specimens collected. Moderate Sedation:      Not Applicable - Patient had care per Anesthesia. Recommendation:           - Patient has a contact number available for                            emergencies. The signs and symptoms of potential                            delayed complications were discussed with the                            patient. Return to  normal activities tomorrow.                            Written discharge instructions were provided to the                            patient.                           - Advance diet as tolerated. Procedure Code(s):        --- Professional ---                           [id number], Esophagogastroduodenoscopy, flexible,                            transoral; with insertion of guide wire followed by                            passage of dilator(s) through esophagus over guide                            wire Diagnosis Code(s):        --- Professional ---                           K22.2, Esophageal obstruction                           R13.10, Dysphagia, unspecified                           K29.00, Acute gastritis without bleeding CPT copyright 2019 [employer]. All rights reserved. The codes documented in this report are preliminary and upon coder review may  be revised to meet current compliance requirements. [name], MD 11/13/2020 12:24:47 PM This report has been signed electronically. Number of Addenda: 0

## 2020-11-13 NOTE — Transfer of Care (Signed)
Immediate Anesthesia Transfer of Care Note  Patient: Don Gross  Procedure(s) Performed: ESOPHAGOGASTRODUODENOSCOPY (EGD) WITH PROPOFOL (N/A ) SAVORY DILATION vs balloon (N/A )  Patient Location: Endoscopy Unit  Anesthesia Type:MAC  Level of Consciousness: drowsy and patient cooperative  Airway & Oxygen Therapy: Patient Spontanous Breathing and Patient connected to face mask oxygen  Post-op Assessment: Report given to RN and Post -op Vital signs reviewed and stable  Post vital signs: Reviewed and stable  Last Vitals:  Vitals Value Taken Time  BP 113/63 11/13/20 1226  Temp    Pulse 57 11/13/20 1226  Resp 12 11/13/20 1226  SpO2 100 % 11/13/20 1226  Vitals shown include unvalidated device data.  Last Pain:  Vitals:   11/13/20 0955  TempSrc: Oral  PainSc: 0-No pain         Complications: No complications documented.

## 2020-11-13 NOTE — Anesthesia Postprocedure Evaluation (Signed)
Anesthesia Post Note  Patient: Don Gross  Procedure(s) Performed: ESOPHAGOGASTRODUODENOSCOPY (EGD) WITH PROPOFOL (N/A ) SAVORY DILATION (N/A )     Patient location during evaluation: PACU Anesthesia Type: MAC Level of consciousness: awake and alert Pain management: pain level controlled Vital Signs Assessment: post-procedure vital signs reviewed and stable Respiratory status: spontaneous breathing, nonlabored ventilation and respiratory function stable Cardiovascular status: stable and blood pressure returned to baseline Postop Assessment: no apparent nausea or vomiting Anesthetic complications: no   No complications documented.  Last Vitals:  Vitals:   11/13/20 1226 11/13/20 1230  BP: 113/63 128/88  Pulse: (!) 57 (!) 55  Resp: 12 19  Temp: (!) 36.3 C   SpO2: 100% 100%    Last Pain:  Vitals:   11/13/20 1230  TempSrc:   PainSc: 0-No pain                 Don Gross A.

## 2020-11-13 NOTE — Discharge Instructions (Signed)

## 2020-11-14 ENCOUNTER — Encounter (HOSPITAL_COMMUNITY): Payer: Self-pay | Admitting: Gastroenterology

## 2021-01-24 ENCOUNTER — Other Ambulatory Visit: Payer: Self-pay | Admitting: Gastroenterology

## 2021-02-18 ENCOUNTER — Other Ambulatory Visit: Payer: Self-pay | Admitting: Gastroenterology

## 2021-02-27 ENCOUNTER — Ambulatory Visit (HOSPITAL_COMMUNITY): Payer: No Typology Code available for payment source

## 2021-02-27 ENCOUNTER — Ambulatory Visit (HOSPITAL_COMMUNITY): Payer: No Typology Code available for payment source | Admitting: Anesthesiology

## 2021-02-27 ENCOUNTER — Encounter (HOSPITAL_COMMUNITY)
Admission: RE | Disposition: A | Discharge status: Home / Self Care | Payer: Self-pay | Source: Home / Self Care | Attending: Gastroenterology

## 2021-02-27 ENCOUNTER — Ambulatory Visit (HOSPITAL_COMMUNITY)
Admission: RE | Admit: 2021-02-27 | Discharge: 2021-02-27 | Disposition: A | Discharge status: Home / Self Care | Payer: No Typology Code available for payment source | Attending: Gastroenterology | Admitting: Gastroenterology

## 2021-02-27 ENCOUNTER — Encounter (HOSPITAL_COMMUNITY): Payer: Self-pay | Admitting: Gastroenterology

## 2021-02-27 ENCOUNTER — Other Ambulatory Visit: Payer: Self-pay

## 2021-02-27 DIAGNOSIS — K222 Esophageal obstruction: Secondary | ICD-10-CM | POA: Insufficient documentation

## 2021-02-27 DIAGNOSIS — K2970 Gastritis, unspecified, without bleeding: Secondary | ICD-10-CM | POA: Insufficient documentation

## 2021-02-27 DIAGNOSIS — R1310 Dysphagia, unspecified: Secondary | ICD-10-CM | POA: Insufficient documentation

## 2021-02-27 DIAGNOSIS — Z79899 Other long term (current) drug therapy: Secondary | ICD-10-CM | POA: Diagnosis not present

## 2021-02-27 HISTORY — PX: ESOPHAGOGASTRODUODENOSCOPY (EGD) WITH PROPOFOL: SHX5813

## 2021-02-27 HISTORY — PX: SAVORY DILATION: SHX5439

## 2021-02-27 SURGERY — ESOPHAGOGASTRODUODENOSCOPY (EGD) WITH PROPOFOL
Anesthesia: Monitor Anesthesia Care

## 2021-02-27 MED ORDER — PROPOFOL 500 MG/50ML IV EMUL
INTRAVENOUS | Status: DC | PRN
  Administered 2021-02-27: 250 ug/kg/min via INTRAVENOUS

## 2021-02-27 MED ORDER — GLYCOPYRROLATE 0.2 MG/ML IJ SOLN
INTRAMUSCULAR | Status: DC | PRN
  Administered 2021-02-27: .1 mg via INTRAVENOUS

## 2021-02-27 MED ORDER — LACTATED RINGERS IV SOLN: INTRAVENOUS | Status: DC

## 2021-02-27 MED ORDER — LIDOCAINE HCL (CARDIAC) PF 100 MG/5ML IV SOSY
PREFILLED_SYRINGE | INTRAVENOUS | Status: DC | PRN
  Administered 2021-02-27: 75 mg via INTRAVENOUS

## 2021-02-27 SURGICAL SUPPLY — 14 items

## 2021-02-27 NOTE — Anesthesia Procedure Notes (Signed)
Procedure Name: MAC Date/Time: 02/27/2021 12:41 PM Performed by: Lissa Morales, CRNA Pre-anesthesia Checklist: Patient identified, Emergency Drugs available, Suction available and Patient being monitored Patient Re-evaluated:Patient Re-evaluated prior to induction Oxygen Delivery Method: Simple face mask Placement Confirmation: positive ETCO2

## 2021-02-27 NOTE — Transfer of Care (Signed)
Immediate Anesthesia Transfer of Care Note  Patient: Don Gross  Procedure(s) Performed: ESOPHAGOGASTRODUODENOSCOPY (EGD) WITH PROPOFOL SAVORY DILATION  Patient Location: PACU  Anesthesia Type:MAC  Level of Consciousness: sedated and patient cooperative  Airway & Oxygen Therapy: Patient Spontanous Breathing and Patient connected to face mask oxygen  Post-op Assessment: Report given to RN and Post -op Vital signs reviewed and stable  Post vital signs: stable  Last Vitals:  Vitals Value Taken Time  BP 96/60 02/27/21 1320  Temp    Pulse 51 02/27/21 1321  Resp 15 02/27/21 1321  SpO2 100 % 02/27/21 1321  Vitals shown include unvalidated device data.  Last Pain:  Vitals:   02/27/21 1121  TempSrc: Oral  PainSc: 0-No pain         Complications: No notable events documented.

## 2021-02-27 NOTE — Interval H&P Note (Signed)
History and Physical Interval Note:  02/27/2021 12:42 PM  Don Gross  has presented today for surgery, with the diagnosis of Dysphagia/esophageal stricture.  The various methods of treatment have been discussed with the patient and family. After consideration of risks, benefits and other options for treatment, the patient has consented to  Procedure(s) with comments: ESOPHAGOGASTRODUODENOSCOPY (EGD) WITH PROPOFOL (N/A) SAVORY DILATION (N/A) - Will need fluoroscopy as a surgical intervention.  The patient's history has been reviewed, patient examined, no change in status, stable for surgery.  I have reviewed the patient's chart and labs.  Questions were answered to the patient's satisfaction.     [name]

## 2021-02-27 NOTE — Discharge Instructions (Signed)
YOU HAD AN ENDOSCOPIC PROCEDURE TODAY: Refer to the procedure report and other information in the discharge instructions given to you for any specific questions about what was found during the examination. If this information does not answer your questions, please call [facility] office at [phone] to clarify.   YOU SHOULD EXPECT: Some feelings of bloating in the abdomen. Passage of more gas than usual. Walking can help get rid of the air that was put into your GI tract during the procedure and reduce the bloating. If you had a lower endoscopy (such as a colonoscopy or flexible sigmoidoscopy) you may notice spotting of blood in your stool or on the toilet paper. Some abdominal soreness may be present for a day or two, also.  DIET: Your first meal following the procedure should be a light meal and then it is ok to progress to your normal diet. A half-sandwich or bowl of soup is an example of a good first meal. Heavy or fried foods are harder to digest and may make you feel nauseous or bloated. Drink plenty of fluids but you should avoid alcoholic beverages for 24 hours. If you had a esophageal dilation, please see attached instructions for diet.    ACTIVITY: Your care partner should take you home directly after the procedure. You should plan to take it easy, moving slowly for the rest of the day. You can resume normal activity the day after the procedure however YOU SHOULD NOT DRIVE, use power tools, machinery or perform tasks that involve climbing or major physical exertion for 24 hours (because of the sedation medicines used during the test).   SYMPTOMS TO REPORT IMMEDIATELY: A gastroenterologist can be reached at any hour. Please call [phone]  for any of the following symptoms:  Following upper endoscopy (EGD, EUS, ERCP, esophageal dilation) Vomiting of blood or coffee ground material  New, significant abdominal pain  New, significant chest pain or pain under the shoulder blades  Painful or  persistently difficult swallowing  New shortness of breath  Black, tarry-looking or red, bloody stools  FOLLOW UP:  If any biopsies were taken you will be contacted by phone or by letter within the next 1-3 weeks. Call [phone]  if you have not heard about the biopsies in 3 weeks.  Please also call with any specific questions about appointments or follow up tests. YOU HAD AN ENDOSCOPIC PROCEDURE TODAY: Refer to the procedure report and other information in the discharge instructions given to you for any specific questions about what was found during the examination. If this information does not answer your questions, please call [facility] office at [phone] to clarify.   

## 2021-02-27 NOTE — Anesthesia Postprocedure Evaluation (Signed)
Anesthesia Post Note  Patient: Jarred Purtee  Procedure(s) Performed: ESOPHAGOGASTRODUODENOSCOPY (EGD) WITH PROPOFOL SAVORY DILATION     Patient location during evaluation: PACU Anesthesia Type: MAC Level of consciousness: awake and alert, patient cooperative and oriented Pain management: pain level controlled Vital Signs Assessment: post-procedure vital signs reviewed and stable Respiratory status: spontaneous breathing, nonlabored ventilation and respiratory function stable Cardiovascular status: blood pressure returned to baseline and stable Postop Assessment: no apparent nausea or vomiting and able to ambulate Anesthetic complications: no   No notable events documented.  Last Vitals:  Vitals:   02/27/21 1330 02/27/21 1340  BP: 106/77 111/79  Pulse: 64 (!) 52  Resp: (!) 22 13  Temp:    SpO2: 95% 98%    Last Pain:  Vitals:   02/27/21 1340  TempSrc:   PainSc: 0-No pain                 Temisha Murley,E. Christyne Mccain

## 2021-02-27 NOTE — H&P (Signed)
Date of Initial H&P: 02/18/21  History reviewed, patient examined, no change in status, stable for surgery. 

## 2021-02-27 NOTE — Op Note (Signed)
[facility] Patient Name: Don Gross Procedure Date: 02/27/2021 MRN: [id number] Attending MD: Lear Ng , MD Date of Birth: January 28, 1976 CSN: [id number] Age: 45 Admit Type: Outpatient Procedure:                Upper GI endoscopy Indications:              Dysphagia, Stenosis of the esophagus Providers:                Lear Ng, MD, Mariana Arn, Mikey College, RN, Cherylynn Ridges, Technician, Tyna Jaksch Technician, Enrigue Catena, CRNA Referring MD:             Caren Macadam Medicines:                Propofol per Anesthesia, Monitored Anesthesia Care Complications:            No immediate complications. Estimated Blood Loss:     Estimated blood loss was minimal. Procedure:                Pre-Anesthesia Assessment:                           - Prior to the procedure, a History and Physical                            was performed, and patient medications and                            allergies were reviewed. The patient's tolerance of                            previous anesthesia was also reviewed. The risks                            and benefits of the procedure and the sedation                            options and risks were discussed with the patient.                            All questions were answered, and informed consent                            was obtained. Prior Anticoagulants: The patient has                            taken no previous anticoagulant or antiplatelet                            agents. ASA Grade Assessment: II - A patient with  mild systemic disease. After reviewing the risks                            and benefits, the patient was deemed in                            satisfactory condition to undergo the procedure.                           After obtaining informed consent, the endoscope was                            passed under direct  vision. Throughout the                            procedure, the patient's blood pressure, pulse, and                            oxygen saturations were monitored continuously. The                            [id number]-H190 ([id number]) Olympus endoscope was introduced                            through the mouth, and advanced to the second part                            of duodenum. The upper GI endoscopy was                            accomplished without difficulty. The patient                            tolerated the procedure well. Scope In: Scope Out: Findings:      One benign-appearing, intrinsic severe (stenosis; an endoscope cannot       pass) stenosis was found in the distal esophagus. The stenosis was       traversed after dilation. A guidewire was placed under fluoroscopic       guidance and the scope was withdrawn. Dilation was performed with a       Savary dilator with no resistance at 10 mm and 11 mm, mild resistance at       12 mm and 12.8 mm and moderate resistance at 14 mm. The dilation site       was examined and showed complete resolution of luminal narrowing.       Estimated blood loss was minimal.      Segmental mild inflammation characterized by congestion (edema) and       erythema was found in the gastric antrum.      The cardia and gastric fundus were normal on retroflexion.      The examined duodenum was normal.      The Z-line was regular and was found 42 cm from the incisors. Impression:               - Benign-appearing esophageal stenosis. Dilated.                           -  Gastritis.                           - Normal examined duodenum.                           - Z-line regular, 42 cm from the incisors.                           - No specimens collected. Moderate Sedation:      N/A - MAC procedure Recommendation:           - Patient has a contact number available for                            emergencies. The signs and symptoms of potential                             delayed complications were discussed with the                            patient. Return to normal activities tomorrow.                            Written discharge instructions were provided to the                            patient.                           - Advance diet as tolerated and clear liquid diet.                           - Return to my office in 3 months. Procedure Code(s):        --- Professional ---                           [phone], Esophagogastroduodenoscopy, flexible,                            transoral; with insertion of guide wire followed by                            passage of dilator(s) through esophagus over guide                            wire Diagnosis Code(s):        --- Professional ---                           R13.10, Dysphagia, unspecified                           K22.2, Esophageal obstruction                           K29.70, Gastritis, unspecified, without bleeding CPT copyright 2019 [employer]. All rights reserved. The codes documented in this report are preliminary and upon coder review may  be revised to meet current compliance requirements. Lear Ng, MD 02/27/2021 1:19:59 PM This report has been signed electronically. Number of Addenda: 0

## 2021-02-27 NOTE — Anesthesia Preprocedure Evaluation (Addendum)
Anesthesia Evaluation  Patient identified by MRN, date of birth, ID band Patient awake    Reviewed: Allergy & Precautions, NPO status , Patient's Chart, lab work & pertinent test results  History of Anesthesia Complications Negative for: history of anesthetic complications  Airway Mallampati: II  TM Distance: >3 FB Neck ROM: Full    Dental  (+) Teeth Intact, Caps, Dental Advisory Given   Pulmonary asthma (no inhaler needed in over a year) ,    breath sounds clear to auscultation       Cardiovascular negative cardio ROS   Rhythm:Regular Rate:Normal     Neuro/Psych negative neurological ROS     GI/Hepatic Neg liver ROS, GERD  Medicated and Controlled,  Endo/Other  negative endocrine ROS  Renal/GU negative Renal ROS     Musculoskeletal   Abdominal   Peds  Hematology negative hematology ROS (+)   Anesthesia Other Findings   Reproductive/Obstetrics                            Anesthesia Physical Anesthesia Plan  ASA: 2  Anesthesia Plan: MAC   Post-op Pain Management:    Induction:   PONV Risk Score and Plan: 1 and Ondansetron  Airway Management Planned: Natural Airway and Nasal Cannula  Additional Equipment: None  Intra-op Plan:   Post-operative Plan:   Informed Consent: I have reviewed the patients History and Physical, chart, labs and discussed the procedure including the risks, benefits and alternatives for the proposed anesthesia with the patient or authorized representative who has indicated his/her understanding and acceptance.     Dental advisory given  Plan Discussed with: CRNA and Surgeon  Anesthesia Plan Comments:        Anesthesia Quick Evaluation

## 2021-02-28 ENCOUNTER — Encounter (HOSPITAL_COMMUNITY): Payer: Self-pay | Admitting: Gastroenterology

## 2021-09-05 IMAGING — CR DG CHEST 2V
2 series · 2 of 2 positions shown · non-contrast
Comparison: None.

CLINICAL DATA: Food impaction, dysphagia

EXAM:
CHEST - 2 VIEW

[w chest pa]
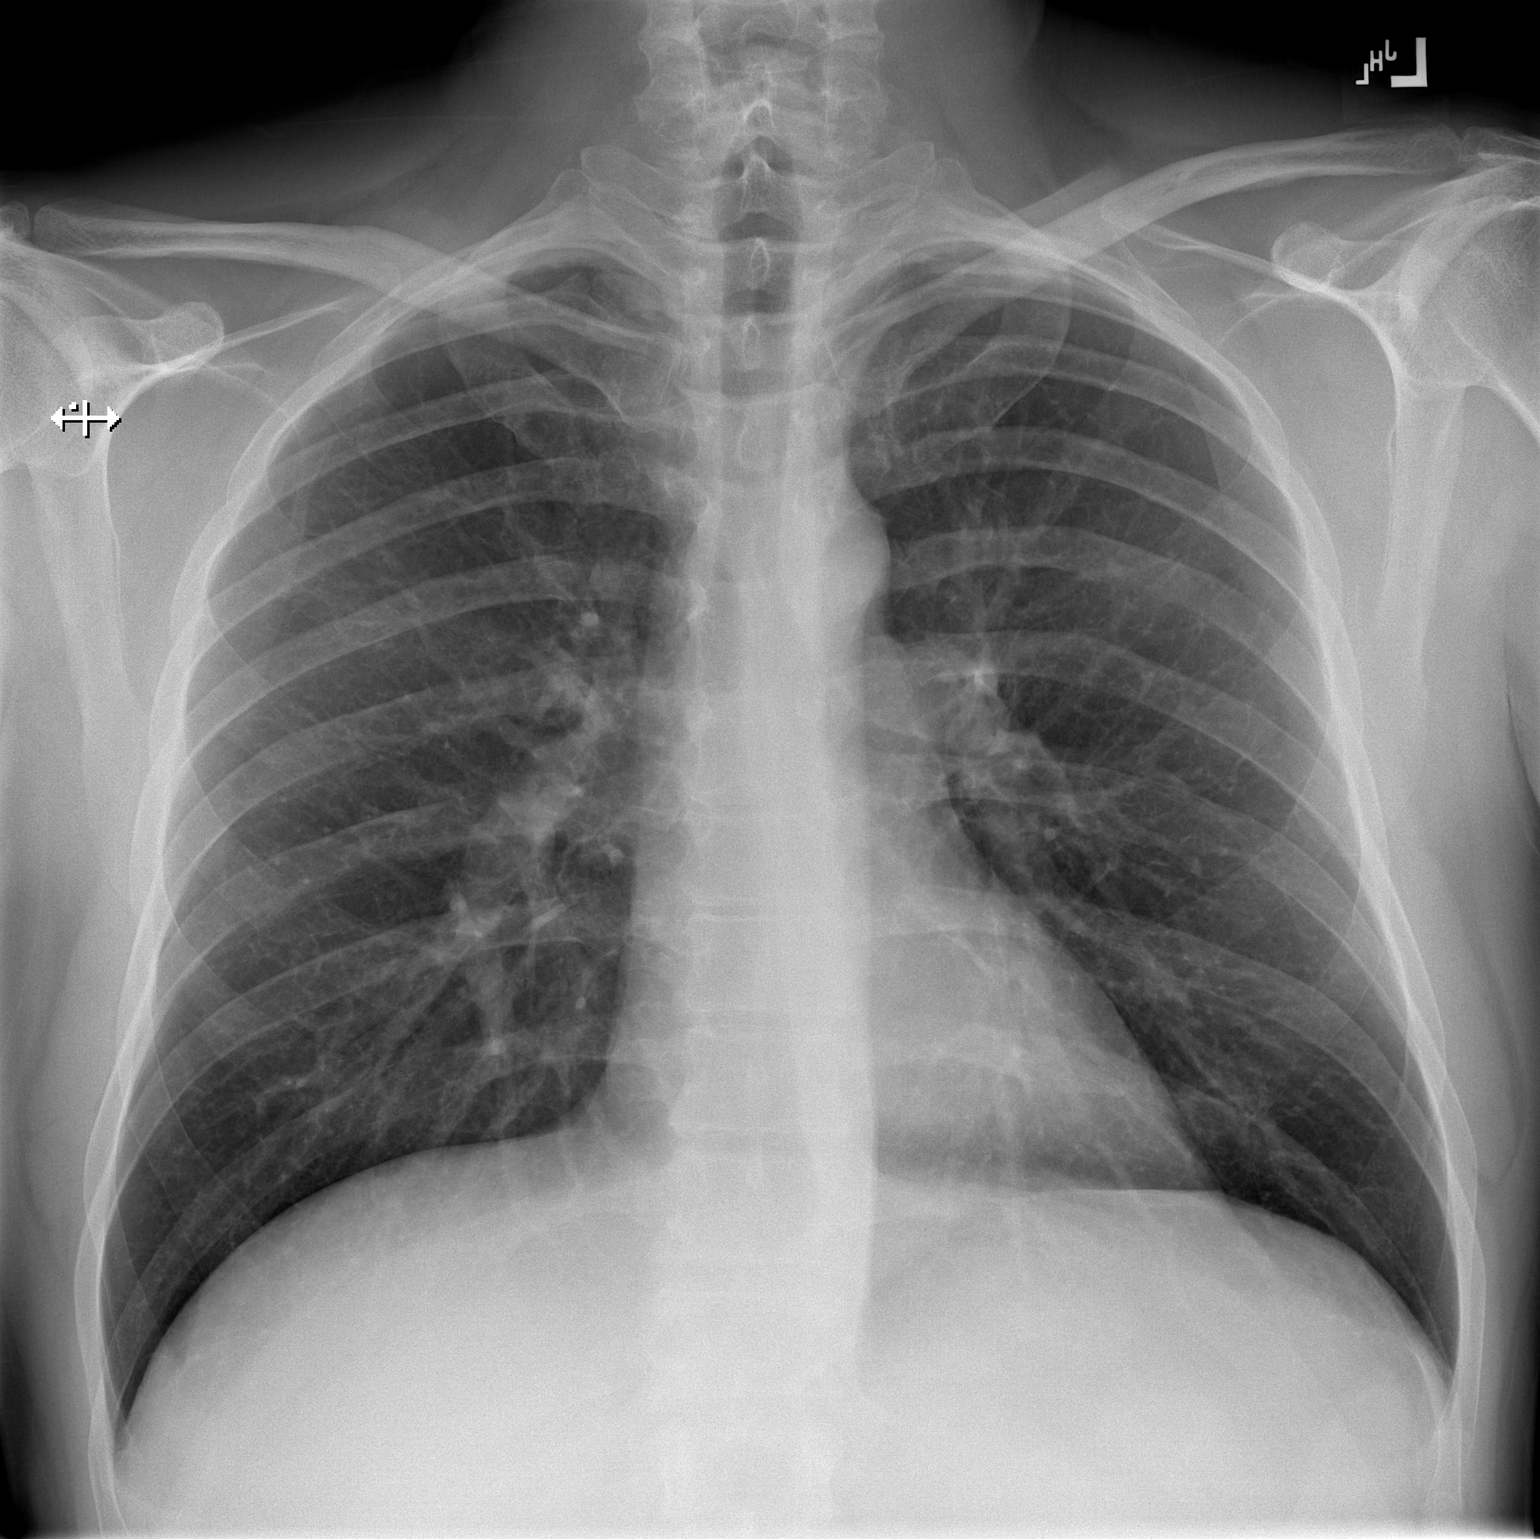

[w chest lat]
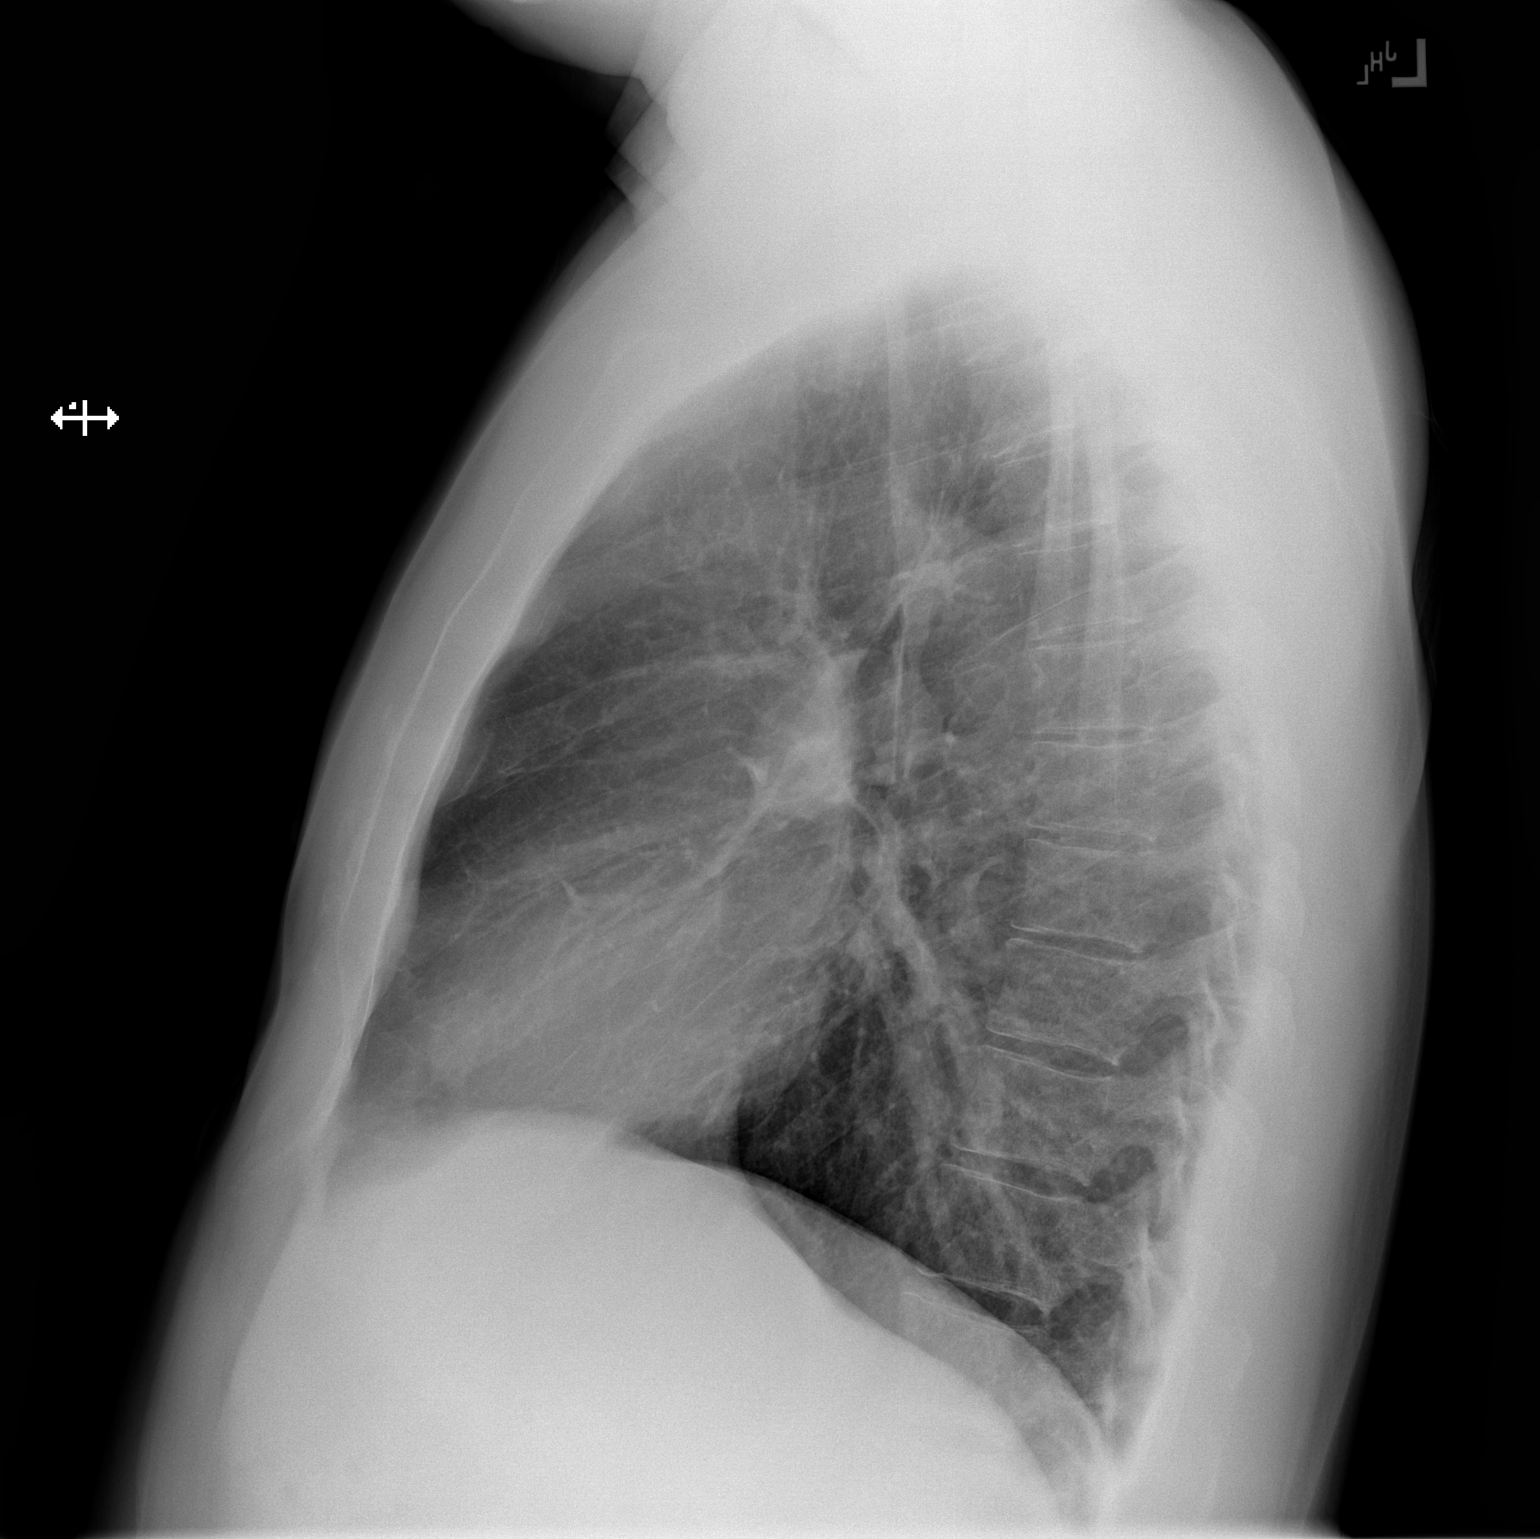

[2 of 2 positions shown; findings below may reference images not displayed]

FINDINGS: The heart size and mediastinal contours are within normal limits.
Both lungs are clear. The visualized skeletal structures are
unremarkable.
IMPRESSION: Negative.
